# Patient Record
Sex: Female | Born: 1990 | Hispanic: Yes
Health system: Southern US, Community
[De-identification: ages and names within clinical notes are randomized; demographics above are authoritative.]

---

## 2017-04-18 ENCOUNTER — Emergency Department
Admission: EM | Admit: 2017-04-18 | Discharge: 2017-04-18 | Disposition: A | Discharge status: Home / Self Care | Payer: Managed Care, Other (non HMO) | Attending: Emergency Medicine | Admitting: Emergency Medicine

## 2017-04-18 ENCOUNTER — Emergency Department: Payer: Managed Care, Other (non HMO)

## 2017-04-18 ENCOUNTER — Other Ambulatory Visit: Payer: Self-pay

## 2017-04-18 DIAGNOSIS — K2900 Acute gastritis without bleeding: Secondary | ICD-10-CM | POA: Insufficient documentation

## 2017-04-18 DIAGNOSIS — F439 Reaction to severe stress, unspecified: Secondary | ICD-10-CM | POA: Diagnosis not present

## 2017-04-18 DIAGNOSIS — H9390 Unspecified disorder of ear, unspecified ear: Secondary | ICD-10-CM | POA: Insufficient documentation

## 2017-04-18 DIAGNOSIS — R109 Unspecified abdominal pain: Secondary | ICD-10-CM | POA: Diagnosis present

## 2017-04-18 LAB — CBC
HEMATOCRIT: 42.7 % (ref 35.0–47.0)
HEMOGLOBIN: 14.4 g/dL (ref 12.0–16.0)
MCH: 28.3 pg (ref 26.0–34.0)
MCHC: 33.8 g/dL (ref 32.0–36.0)
MCV: 83.8 fL (ref 80.0–100.0)
Platelets: 292 10*3/uL (ref 150–440)
RBC: 5.1 MIL/uL (ref 3.80–5.20)
RDW: 13.8 % (ref 11.5–14.5)
WBC: 11.1 10*3/uL — ABNORMAL HIGH (ref 3.6–11.0)

## 2017-04-18 LAB — URINALYSIS, COMPLETE (UACMP) WITH MICROSCOPIC
BACTERIA UA: NONE SEEN
Bilirubin Urine: NEGATIVE
Glucose, UA: NEGATIVE mg/dL
HGB URINE DIPSTICK: NEGATIVE
KETONES UR: NEGATIVE mg/dL
Leukocytes, UA: NEGATIVE
Nitrite: NEGATIVE
PROTEIN: NEGATIVE mg/dL
Specific Gravity, Urine: 1.025 (ref 1.005–1.030)
pH: 7 (ref 5.0–8.0)

## 2017-04-18 LAB — COMPREHENSIVE METABOLIC PANEL
ALBUMIN: 4.3 g/dL (ref 3.5–5.0)
ALT: 31 U/L (ref 14–54)
ANION GAP: 7 (ref 5–15)
AST: 22 U/L (ref 15–41)
Alkaline Phosphatase: 84 U/L (ref 38–126)
BUN: 11 mg/dL (ref 6–20)
CHLORIDE: 106 mmol/L (ref 101–111)
CO2: 26 mmol/L (ref 22–32)
Calcium: 9.1 mg/dL (ref 8.9–10.3)
Creatinine, Ser: 0.64 mg/dL (ref 0.44–1.00)
GFR calc Af Amer: >60 mL/min (ref >60)
GFR calc non Af Amer: >60 mL/min (ref >60)
GLUCOSE: 105 mg/dL — AB (ref 65–99)
POTASSIUM: 4 mmol/L (ref 3.5–5.1)
SODIUM: 139 mmol/L (ref 135–145)
Total Bilirubin: 0.4 mg/dL (ref 0.3–1.2)
Total Protein: 7.6 g/dL (ref 6.5–8.1)

## 2017-04-18 LAB — LIPASE, BLOOD: Lipase: 27 U/L (ref 11–51)

## 2017-04-18 LAB — POCT PREGNANCY, URINE: PREG TEST UR: NEGATIVE

## 2017-04-18 MED ORDER — OMEPRAZOLE 20 MG PO CPDR: 20.0000 mg | DELAYED_RELEASE_CAPSULE | Freq: Every day | ORAL | 1 refills | Status: AC

## 2017-04-18 NOTE — ED Notes (Signed)
Pt verbalizes understanding of discharge instructions.

## 2017-04-18 NOTE — ED Triage Notes (Addendum)
Pt presents to ED with c/o feeling like her "nerves are shot". Pt states she has "different kinds of pains" such as tightness in the sides of her neck, occasional pain in her arms, having difficulty laying down because her abd goes up towards her chest and she feels like she "has to push it back down". Pt states that her face feels "tight and droopy". Pt states "I'm not sure if this is anxiety but it has been bothering me for a long time and Im tired of people telling me its my anxiety. I've been trying to let it go but I feel like something is wrong with me." symptoms have been ongoing for over a month. Pt currently has no increased work of breathing or acute distress noted.

## 2017-04-18 NOTE — ED Notes (Signed)
Pt signed discharge paper computer not working in room 44

## 2017-04-18 NOTE — ED Provider Notes (Signed)
[facility] Emergency Department Provider Note   ____________________________________________    I have reviewed the triage vital signs and the nursing notes.   HISTORY  Chief Complaint Anxiety and Abdominal Pain     HPI Diana Gonzalez is a 27 y.o. female who presents with multiple complaints which she believes are all related primarily to anxiety.  She describes a pressure-like sensation in her ears intermittently over the last month.  No headache associated with this.  No nasal drainage.  She does feel better when she "pops her ears ".  Additionally she has had pain that has radiated from her left neck to her left arm occasionally although not recently.  This seems to happen primarily at night when she was rolling over in bed.  No chest pain.  No numbness or tingling.  Additionally she feels a pressure in her epigastrium when she is trying to sleep at night, she makes this feel better by lying on her fist    No past medical history on file.  There are no active problems to display for this patient.     Prior to Admission medications   Medication Sig Start Date End Date Taking? Authorizing Provider  omeprazole (PRILOSEC) 20 MG capsule Take 1 capsule (20 mg total) by mouth daily. 04/18/17 04/18/18  Jene Every, MD     Allergies Patient has no known allergies.  No family history on file.  Social History Does not smoke, denies EtOH abuse Review of Systems  Constitutional: No fever/chills Eyes: No visual changes.  ENT: No sore throat.  Your symptoms as above Cardiovascular: Denies chest pain. Respiratory: Denies shortness of breath. Gastrointestinal: No nausea, no vomiting.   Genitourinary: Negative for dysuria. Musculoskeletal: Negative for back pain. Skin: Negative for rash. Neurological: Negative for headaches   ____________________________________________   PHYSICAL EXAM:  VITAL SIGNS: ED Triage Vitals  Enc Vitals Group   BP 04/18/17 2158 (!) 127/98     Pulse Rate 04/18/17 2158 94     Resp 04/18/17 2158 20     Temp 04/18/17 2158 98.2 F (36.8 C)     Temp Source 04/18/17 2158 Oral     SpO2 04/18/17 2158 97 %     Weight 04/18/17 2159 (!) 147.4 kg (325 lb)     Height 04/18/17 2159 1.702 m (5\' 7" )     Head Circumference --      Peak Flow --      Pain Score --      Pain Loc --      Pain Edu? --      Excl. in GC? --     Constitutional: Alert and oriented. No acute distress. Pleasant and interactive Eyes: Conjunctivae are normal.   Nose: No congestion/rhinnorhea. Mouth/Throat: Mucous membranes are moist.  Pharynx normal Ears: Normal TMs Cardiovascular: Normal rate, regular rhythm. Grossly normal heart sounds.  Good peripheral circulation. Respiratory: Normal respiratory effort.  No retractions.  Gastrointestinal: Soft and nontender. No distention.  Genitourinary: deferred Musculoskeletal:  Warm and well perfused Neurologic:  Normal speech and language. No gross focal neurologic deficits are appreciated.  Skin:  Skin is warm, dry and intact. No rash noted. Psychiatric: Mood and affect are normal. Speech and behavior are normal.  ____________________________________________   LABS (all labs ordered are listed, but only abnormal results are displayed)  Labs Reviewed  COMPREHENSIVE METABOLIC PANEL - Abnormal; Notable for the following components:      Result Value   Glucose, Bld 105 (*)  All other components within normal limits  CBC - Abnormal; Notable for the following components:   WBC 11.1 (*)    All other components within normal limits  URINALYSIS, COMPLETE (UACMP) WITH MICROSCOPIC - Abnormal; Notable for the following components:   Color, Urine YELLOW (*)    APPearance HAZY (*)    Squamous Epithelial / LPF 0-5 (*)    All other components within normal limits  LIPASE, BLOOD  POC URINE PREG, ED  POCT PREGNANCY, URINE    ____________________________________________  EKG  None ____________________________________________  RADIOLOGY  Chest x-ray normal ____________________________________________   PROCEDURES  Procedure(s) performed: No  Procedures   Critical Care performed: No ____________________________________________   INITIAL IMPRESSION / ASSESSMENT AND PLAN / ED COURSE  Pertinent labs & imaging results that were available during my care of the patient were reviewed by me and considered in my medical decision making (see chart for details).  Patient well-appearing in no acute distress.  Multiple complaints none of which appear to be emergent.  Discussed with her she may have mild gastritis and we will trial omeprazole, also recommended follow-up with PCP for further evaluation of your complaints.  Encourage exercise/stress reduction    ____________________________________________   FINAL CLINICAL IMPRESSION(S) / ED DIAGNOSES  Final diagnoses:  Acute gastritis without hemorrhage, unspecified gastritis type  Stress        Note:  This document was prepared using Dragon voice recognition software and may include unintentional dictation errors.    Jene EveryKinner, Shereda Graw, MD 04/18/17 [phone]

## 2019-08-01 IMAGING — CR DG CHEST 2V
3 series · 3 of 3 positions shown · non-contrast
Comparison: None.

CLINICAL DATA: Anxiety and chest tightness for 1 month.

EXAM:
CHEST  2 VIEW

[chest pa (1 of 2)]
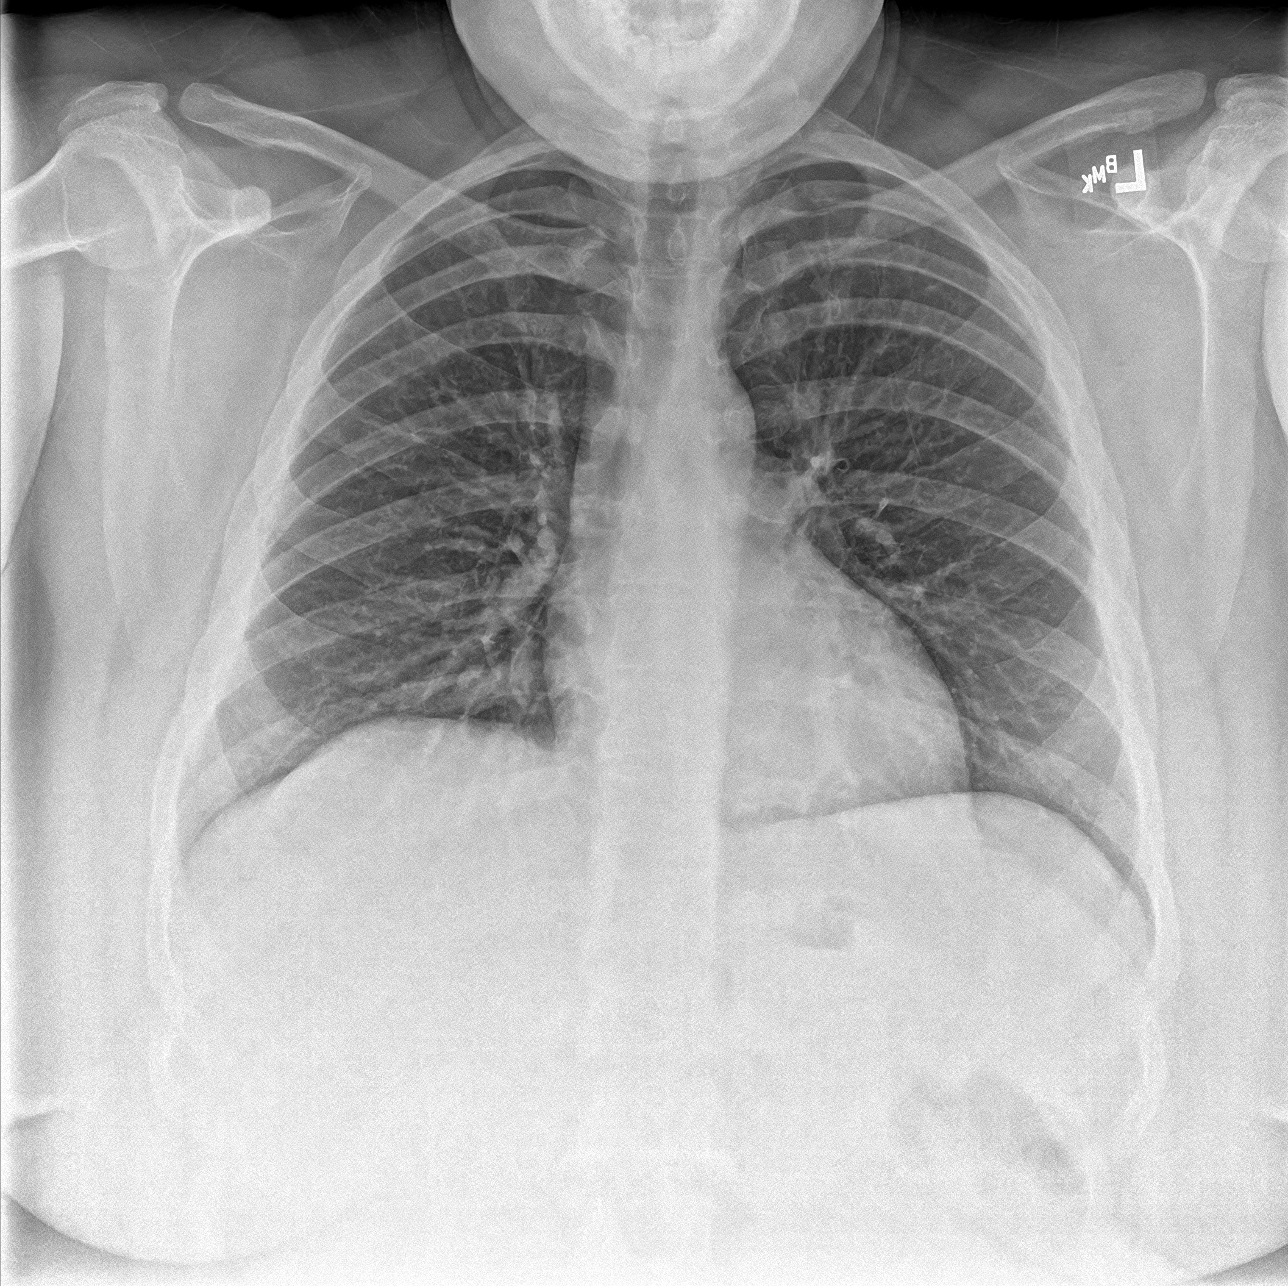

[chest lat]
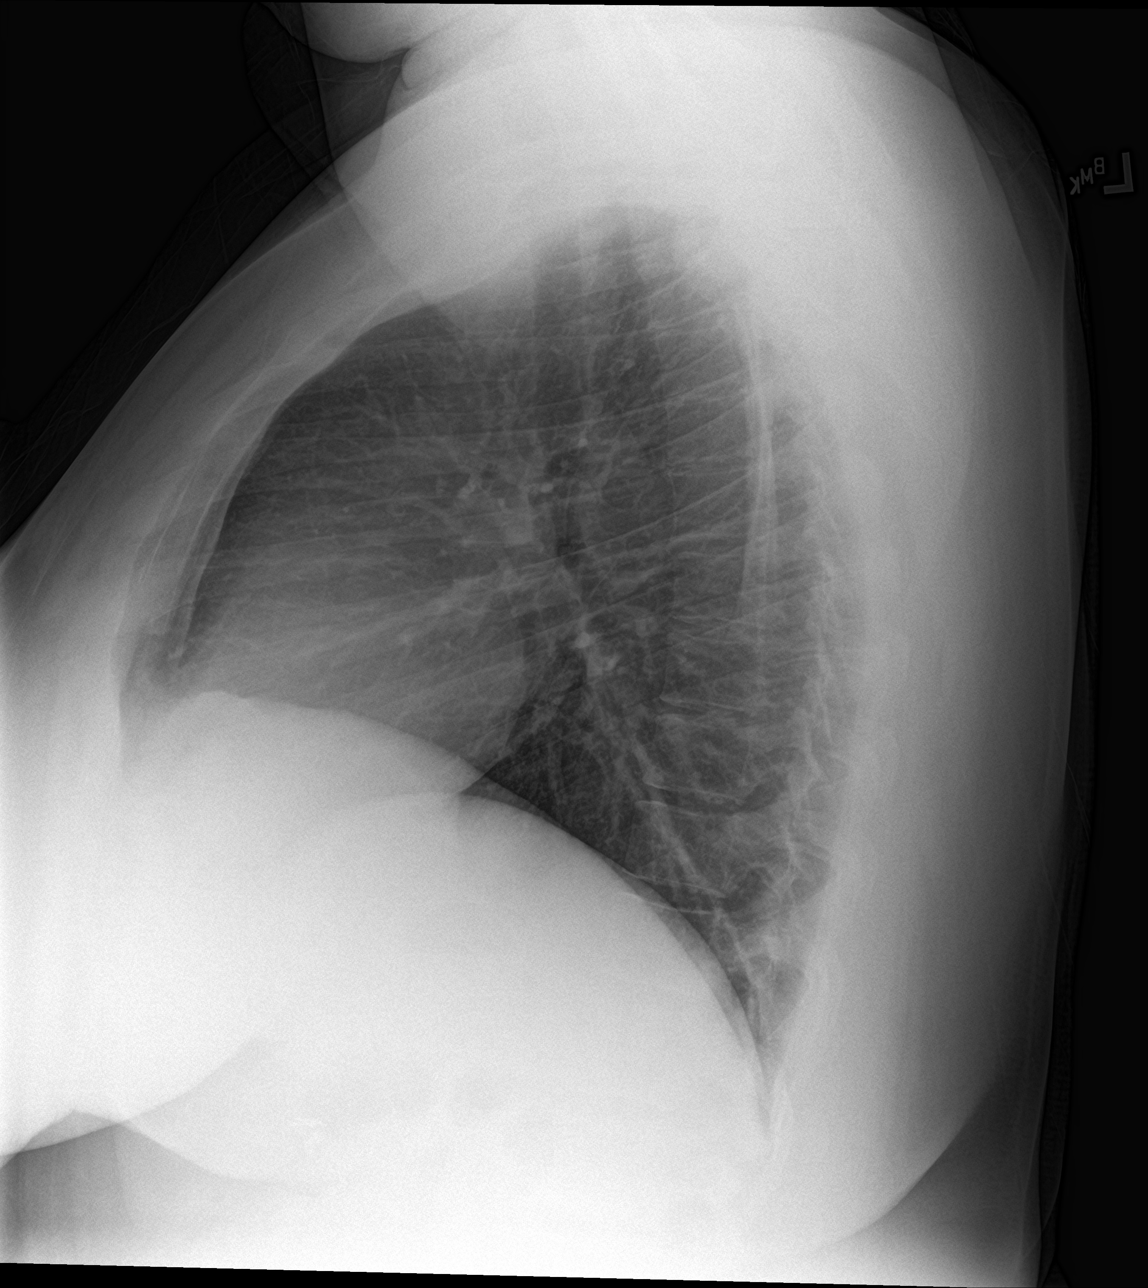

[chest pa (2 of 2)]
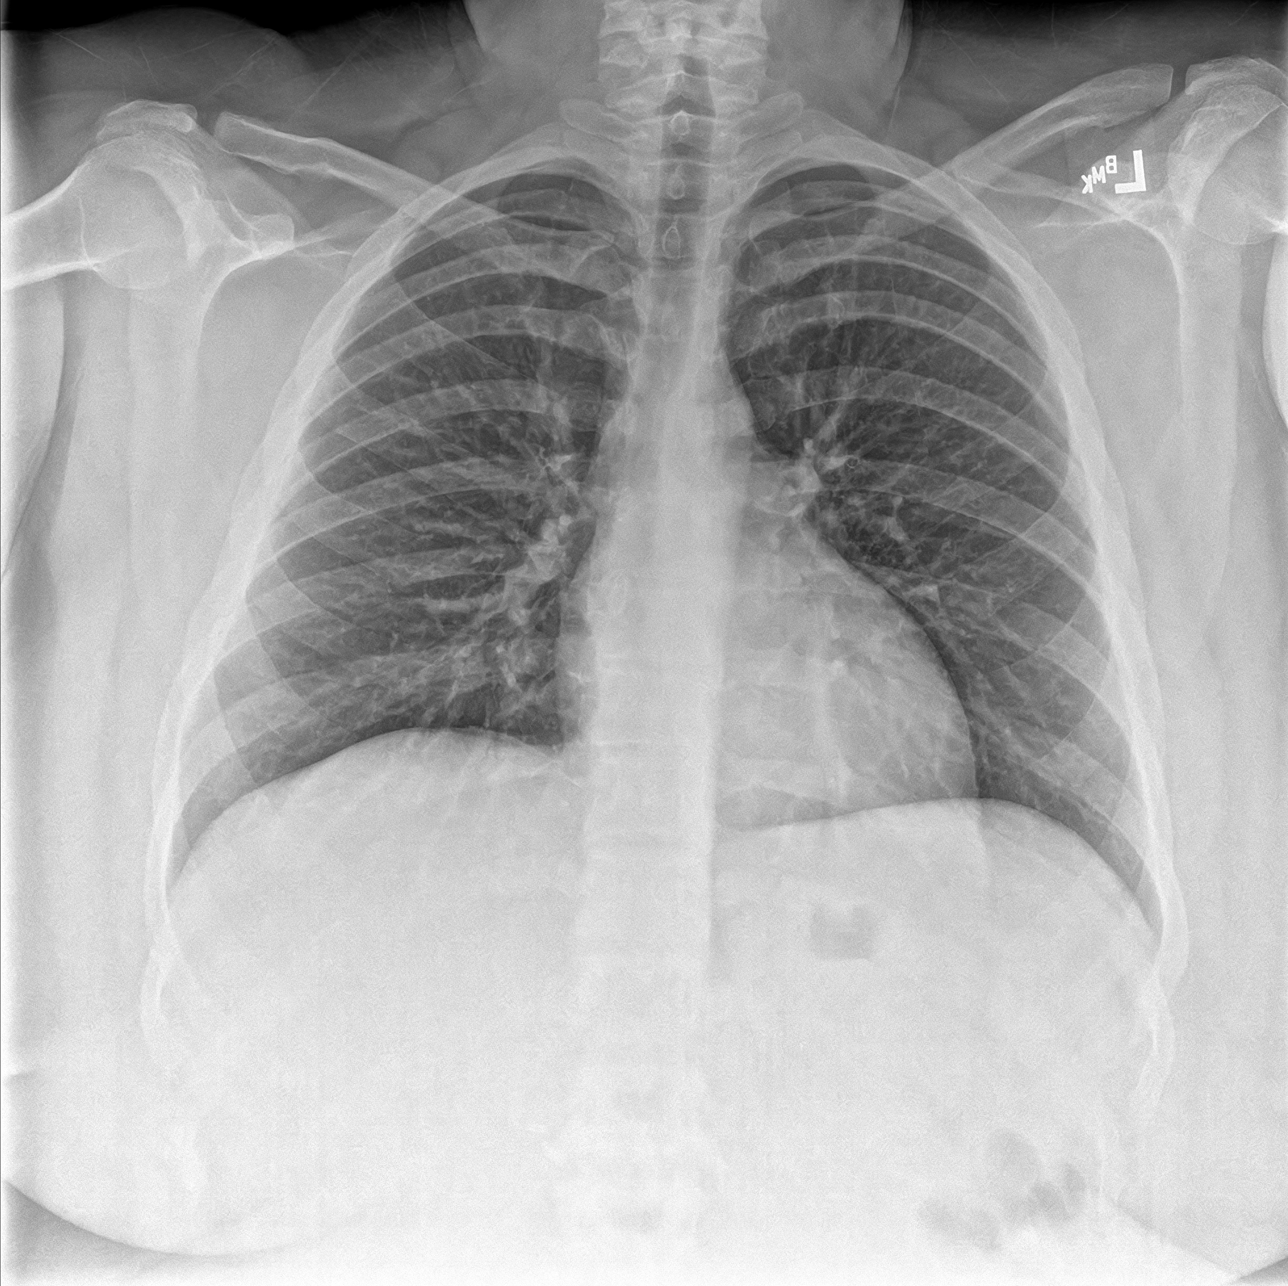

[3 of 3 positions shown; findings below may reference images not displayed]

FINDINGS: The heart size and mediastinal contours are within normal limits.
Both lungs are clear. The visualized skeletal structures are
unremarkable.
IMPRESSION: No active cardiopulmonary disease.
